# Patient Record
Sex: Female | Born: 1995 | Hispanic: No | Marital: Single | Smoking: Former smoker
Health system: Southern US, Community
[De-identification: ages and names within clinical notes are randomized; demographics above are authoritative.]

## PROBLEM LIST (undated history)

## (undated) DIAGNOSIS — A749 Chlamydial infection, unspecified: Secondary | ICD-10-CM

## (undated) DIAGNOSIS — A549 Gonococcal infection, unspecified: Secondary | ICD-10-CM

## (undated) DIAGNOSIS — R011 Cardiac murmur, unspecified: Secondary | ICD-10-CM

## (undated) HISTORY — PX: NO PAST SURGERIES: SHX2092

---
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
## 2013-04-16 DIAGNOSIS — A549 Gonococcal infection, unspecified: Secondary | ICD-10-CM

## 2013-04-16 HISTORY — DX: Gonococcal infection, unspecified: A54.9

## 2013-05-02 ENCOUNTER — Encounter (HOSPITAL_COMMUNITY): Payer: Self-pay | Admitting: Emergency Medicine

## 2013-05-02 ENCOUNTER — Emergency Department (HOSPITAL_COMMUNITY)
Admission: EM | Admit: 2013-05-02 | Discharge: 2013-05-02 | Disposition: A | Discharge status: Home / Self Care | Payer: Medicaid Other | Attending: Emergency Medicine | Admitting: Emergency Medicine

## 2013-05-02 ENCOUNTER — Emergency Department (HOSPITAL_COMMUNITY): Payer: Medicaid Other

## 2013-05-02 DIAGNOSIS — Z87891 Personal history of nicotine dependence: Secondary | ICD-10-CM | POA: Insufficient documentation

## 2013-05-02 DIAGNOSIS — R05 Cough: Secondary | ICD-10-CM | POA: Insufficient documentation

## 2013-05-02 DIAGNOSIS — R059 Cough, unspecified: Secondary | ICD-10-CM | POA: Insufficient documentation

## 2013-05-02 DIAGNOSIS — N750 Cyst of Bartholin's gland: Secondary | ICD-10-CM | POA: Insufficient documentation

## 2013-05-02 MED ORDER — CEPHALEXIN 500 MG PO CAPS: 500.0000 mg | ORAL_CAPSULE | Freq: Four times a day (QID) | ORAL | Status: DC

## 2013-05-02 MED ORDER — HYDROCODONE-ACETAMINOPHEN 5-325 MG PO TABS: 2.0000 | ORAL_TABLET | Freq: Four times a day (QID) | ORAL | Status: DC | PRN

## 2013-05-02 NOTE — ED Provider Notes (Signed)
CSN: [id number]     Arrival date & time 05/02/13  1426 History   First MD Initiated Contact with Patient 05/02/13 1827     No chief complaint on file.  (Consider location/radiation/quality/duration/timing/severity/associated sxs/prior Treatment) HPI Comments: Patient presents emergency department with chief complaint of cough. She states that she has had a productive cough for the past 5 days. She states that this also irritates her throat. States that she has seen some dark sputum that is mixed with yellow sputum. She denies any chest pain or shortness of breath. Additionally, she states that she has a painful area near her vagina. She states that this is been gradually worsening over the past 2 days. She denies any drainage. Denies any vaginal discharge, or dysuria. She uses the nuvaring for birth control.  The history is provided by the patient. No language interpreter was used.    History reviewed. No pertinent past medical history. History reviewed. No pertinent past surgical history. No family history on file. History  Substance Use Topics  . Smoking status: Former Games developer  . Smokeless tobacco: Not on file  . Alcohol Use: Yes     Comment: occ   OB History   Grav Para Term Preterm Abortions TAB SAB Ect Mult Living                 Review of Systems  All other systems reviewed and are negative.    Allergies  Review of patient's allergies indicates no known allergies.  Home Medications   Current Outpatient Rx  Name  Route  Sig  Dispense  Refill  . etonogestrel-ethinyl estradiol (NUVARING) 0.12-0.015 MG/24HR vaginal ring   Vaginal   Place 1 each vaginally every 28 (twenty-eight) days. Insert vaginally and leave in place for 3 consecutive weeks, then remove for 1 week.          BP 137/85  Pulse 58  Temp(Src) 98.1 F (36.7 C) (Oral)  Resp 18  Wt 180 lb 6 oz (81.818 kg)  SpO2 100%  LMP 04/22/2013 Physical Exam  Nursing note and vitals reviewed. Constitutional: She  is oriented to person, place, and time. She appears well-developed and well-nourished.  HENT:  Head: Normocephalic and atraumatic.  Eyes: Conjunctivae and EOM are normal. Pupils are equal, round, and reactive to light.  Neck: Normal range of motion. Neck supple.  Cardiovascular: Normal rate and regular rhythm.  Exam reveals no gallop and no friction rub.   No murmur heard. Pulmonary/Chest: Effort normal and breath sounds normal. No respiratory distress. She has no wheezes. She has no rales. She exhibits no tenderness.  Abdominal: Soft. She exhibits no distension and no mass. There is no tenderness. There is no rebound and no guarding.  Genitourinary:  Chaperone present during exam, very small Bartholin's cyst is noted, there is no surrounding erythema, no induration or purulent discharge  Musculoskeletal: Normal range of motion. She exhibits no edema and no tenderness.  Neurological: She is alert and oriented to person, place, and time.  Skin: Skin is warm and dry.  Psychiatric: She has a normal mood and affect. Her behavior is normal. Judgment and thought content normal.    ED Course  Procedures (including critical care time) No results found for this or any previous visit. Dg Chest 2 View  05/02/2013   CLINICAL DATA:  Productive cough.  EXAM: CHEST  2 VIEW  COMPARISON:  None.  FINDINGS: The heart size and mediastinal contours are within normal limits. Both lungs are clear. The visualized  skeletal structures are unremarkable.  IMPRESSION: No active cardiopulmonary disease.   Electronically Signed   By: Loralie Champagne M.D.   On: 05/02/2013 19:42      EKG Interpretation   None       MDM   1. Bartholin's cyst   2. Cough     Patient with cough and developing Bartholin's cyst. At this time, will try conservative treatment for the Bartholin's cyst including sitz baths and antibiotic. Recommend outpatient followup with women's. There is no erythema or induration. Do not feel that it  would be amenable to incision and drainage at this time.  CXR is negative.  Well's PE criteria is 1, low-risk, 1.3% chance in ED population.  I highly doubt PE. No chest pain or SOB.  Vitals are stable.  Well appearing.  Patient discussed with Dr. Karma Ganja, who agrees with the plan.    Roxy Horseman, PA-C 05/02/13 [phone]

## 2013-05-02 NOTE — ED Provider Notes (Signed)
Medical screening examination/treatment/procedure(s) were performed by non-physician practitioner and as supervising physician I was immediately available for consultation/collaboration.  EKG Interpretation   None        Mahika Vanvoorhis K Linker, MD 05/02/13 2003 

## 2013-05-02 NOTE — ED Notes (Signed)
Pt states that she has been coughing up red/black mucous.  Second issue she has bump to left side of vagina area that is painful

## 2013-05-06 ENCOUNTER — Encounter (HOSPITAL_COMMUNITY): Payer: Self-pay | Admitting: General Practice

## 2013-05-06 ENCOUNTER — Inpatient Hospital Stay (HOSPITAL_COMMUNITY)
Admission: AD | Admit: 2013-05-06 | Discharge: 2013-05-06 | Disposition: A | Discharge status: Home / Self Care | Payer: Medicaid Other | Source: Ambulatory Visit | Attending: Obstetrics & Gynecology | Admitting: Obstetrics & Gynecology

## 2013-05-06 DIAGNOSIS — N949 Unspecified condition associated with female genital organs and menstrual cycle: Secondary | ICD-10-CM | POA: Insufficient documentation

## 2013-05-06 DIAGNOSIS — Z87891 Personal history of nicotine dependence: Secondary | ICD-10-CM | POA: Insufficient documentation

## 2013-05-06 DIAGNOSIS — N750 Cyst of Bartholin's gland: Secondary | ICD-10-CM

## 2013-05-06 LAB — URINE MICROSCOPIC-ADD ON

## 2013-05-06 LAB — WET PREP, GENITAL
Clue Cells Wet Prep HPF POC: NONE SEEN
Trich, Wet Prep: NONE SEEN
Yeast Wet Prep HPF POC: NONE SEEN

## 2013-05-06 LAB — URINALYSIS, ROUTINE W REFLEX MICROSCOPIC
Bilirubin Urine: NEGATIVE
Glucose, UA: NEGATIVE mg/dL
Ketones, ur: NEGATIVE mg/dL
Protein, ur: NEGATIVE mg/dL
Urobilinogen, UA: 0.2 mg/dL (ref 0.0–1.0)

## 2013-05-06 LAB — POCT PREGNANCY, URINE: Preg Test, Ur: NEGATIVE

## 2013-05-06 NOTE — MAU Provider Note (Signed)
History     CSN: [id number]  Arrival date and time: 05/06/13 1735   First Provider Initiated Contact with Patient 05/06/13 2024      Chief Complaint  Patient presents with  . Vaginal Pain   HPI Ms. Alexa Schaefer is a 17 y.o. G0 female who presents to MAU today with complaint of thin, white malodorous discharge and a lump in her vaginal area. The patient states that she is currently sexually active with 1 partner. She has had ~ 3 partners in the past 6 months. She states that she uses condoms, but had one encounter without a condom recently. She uses the nuva rings for birth control. She denies vaginal bleeding, fever, pelvic pain, N/V/D or constipation or UTI symptoms. She was seen at [facility] recently with complaint of the vaginal bump and given Keflex which she just started taking yesterday.   OB History   Grav Para Term Preterm Abortions TAB SAB Ect Mult Living                  History reviewed. No pertinent past medical history.  History reviewed. No pertinent past surgical history.  History reviewed. No pertinent family history.  History  Substance Use Topics  . Smoking status: Former Games developer  . Smokeless tobacco: Not on file  . Alcohol Use: Yes     Comment: occ    Allergies: No Known Allergies  Prescriptions prior to admission  Medication Sig Dispense Refill  . cephALEXin (KEFLEX) 500 MG capsule Take 1 capsule (500 mg total) by mouth 4 (four) times daily.  40 capsule  0  . HYDROcodone-acetaminophen (NORCO/VICODIN) 5-325 MG per tablet Take 2 tablets by mouth every 6 (six) hours as needed.  15 tablet  0  . etonogestrel-ethinyl estradiol (NUVARING) 0.12-0.015 MG/24HR vaginal ring Place 1 each vaginally every 28 (twenty-eight) days. Insert vaginally and leave in place for 3 consecutive weeks, then remove for 1 week.        Review of Systems  Constitutional: Negative for fever and malaise/fatigue.  Gastrointestinal: Negative for nausea, vomiting, abdominal pain, diarrhea  and constipation.  Genitourinary: Negative for dysuria, urgency and frequency.       + vaginal discharge  Skin: Negative for rash.       + lump in vaginal area   Physical Exam   Blood pressure 144/82, pulse 67, temperature 98.7 F (37.1 C), temperature source Oral, resp. rate 16, height 5' 6.5" (1.689 m), weight 176 lb 3.2 oz (79.924 kg), last menstrual period 04/22/2013, SpO2 100.00%.  Physical Exam  Constitutional: She is oriented to person, place, and time. She appears well-developed and well-nourished. No distress.  HENT:  Head: Normocephalic and atraumatic.  Cardiovascular: Normal rate, regular rhythm and normal heart sounds.   Respiratory: Effort normal and breath sounds normal. No respiratory distress.  GI: Soft. Bowel sounds are normal. She exhibits no distension and no mass. There is no tenderness. There is no rebound and no guarding.  Genitourinary:    There is tenderness (over the bartholin's gland) on the left labia. Uterus is not enlarged and not tender. Cervix exhibits no motion tenderness, no discharge and no friability. Right adnexum displays no mass and no tenderness. Left adnexum displays no mass and no tenderness. No bleeding around the vagina. Vaginal discharge (scant thin, white discharge noted) found.  Neurological: She is alert and oriented to person, place, and time.  Skin: Skin is warm and dry. No erythema.  Psychiatric: She has a normal mood and affect.  Results for orders placed during the hospital encounter of 05/06/13 (from the past 24 hour(s))  URINALYSIS, ROUTINE W REFLEX MICROSCOPIC     Status: Abnormal   Collection Time    05/06/13  5:40 PM      Result Value Range   Color, Urine YELLOW  YELLOW   APPearance CLEAR  CLEAR   Specific Gravity, Urine >1.030 (*) 1.005 - 1.030   pH 6.0  5.0 - 8.0   Glucose, UA NEGATIVE  NEGATIVE mg/dL   Hgb urine dipstick MODERATE (*) NEGATIVE   Bilirubin Urine NEGATIVE  NEGATIVE   Ketones, ur NEGATIVE  NEGATIVE mg/dL     Protein, ur NEGATIVE  NEGATIVE mg/dL   Urobilinogen, UA 0.2  0.0 - 1.0 mg/dL   Nitrite NEGATIVE  NEGATIVE   Leukocytes, UA NEGATIVE  NEGATIVE  URINE MICROSCOPIC-ADD ON     Status: Abnormal   Collection Time    05/06/13  5:40 PM      Result Value Range   Squamous Epithelial / LPF RARE  RARE   WBC, UA 0-2  <3 WBC/hpf   RBC / HPF 7-10  <3 RBC/hpf   Bacteria, UA FEW (*) RARE  POCT PREGNANCY, URINE     Status: None   Collection Time    05/06/13  6:47 PM      Result Value Range   Preg Test, Ur NEGATIVE  NEGATIVE  WET PREP, GENITAL     Status: Abnormal   Collection Time    05/06/13  8:50 PM      Result Value Range   Yeast Wet Prep HPF POC NONE SEEN  NONE SEEN   Trich, Wet Prep NONE SEEN  NONE SEEN   Clue Cells Wet Prep HPF POC NONE SEEN  NONE SEEN   WBC, Wet Prep HPF POC FEW (*) NONE SEEN    MAU Course  Procedures None  MDM UA, Wet prep and GC/Chlamydia today  Assessment and Plan  A: Bartholin's gland cyst  P: Discharge home Patient advised to continue Keflex Rx from [facility] Patient advised to go to [facility] for further STD testing if desired Patient advised to use warm compresses and Sitz baths to attempt to induce spontaneous drainage of the cyst Patient may return to MAU as needed or if her condition were to change or worsen  Freddi Starr, PA-C  05/06/2013, 8:24 PM

## 2013-05-06 NOTE — MAU Note (Signed)
Patient states she has had a "lump" in the inside of the left labia and is painful. Has been there for about 1 1/2 weeks. Has a slight discharge. Wants STI testing.

## 2013-05-07 NOTE — MAU Provider Note (Signed)
Attestation of Attending Supervision of Advanced Practitioner (CNM/NP): Evaluation and management procedures were performed by the Advanced Practitioner under my supervision and collaboration.  I have reviewed the Advanced Practitioner's note and chart, and I agree with the management and plan.  HARRAWAY-SMITH, Bhavin Monjaraz 12:14 AM     

## 2013-05-09 LAB — GC/CHLAMYDIA PROBE AMP: CT Probe RNA: NEGATIVE

## 2013-07-04 ENCOUNTER — Emergency Department (INDEPENDENT_AMBULATORY_CARE_PROVIDER_SITE_OTHER)
Admission: EM | Admit: 2013-07-04 | Discharge: 2013-07-04 | Disposition: A | Discharge status: Home / Self Care | Payer: Medicaid Other | Source: Home / Self Care | Attending: Family Medicine | Admitting: Family Medicine

## 2013-07-04 ENCOUNTER — Encounter (HOSPITAL_COMMUNITY): Payer: Self-pay | Admitting: Emergency Medicine

## 2013-07-04 DIAGNOSIS — J039 Acute tonsillitis, unspecified: Secondary | ICD-10-CM

## 2013-07-04 DIAGNOSIS — J020 Streptococcal pharyngitis: Secondary | ICD-10-CM

## 2013-07-04 MED ORDER — AMOXICILLIN-POT CLAVULANATE 875-125 MG PO TABS
1.0000 | ORAL_TABLET | Freq: Two times a day (BID) | ORAL | Status: DC
Start: 2013-07-04 — End: 2014-01-13

## 2013-07-04 NOTE — ED Notes (Signed)
Dr. Denyse Amassorey cancelled strep screen based on pt.'s history.

## 2013-07-04 NOTE — Discharge Instructions (Signed)
Thank you for coming in today. Take Augmentin twice daily for one week. Take up to 2 Aleve twice daily for pain. Call or go to the emergency room if you get worse, have trouble breathing, have chest pains, or palpitations.

## 2013-07-04 NOTE — ED Provider Notes (Signed)
Alexa Schaefer is a 18 y.o. female who presents to Urgent Care today for one day of sore throat, headache nausea fever. No vomiting or diarrhea. No shortness of breath or cough. Symptoms consistent with prior episodes of strep throat. Roommate with strep throat. Sore throat is severe. She's tried some over-the-counter medications which have helped only a little.   History reviewed. No pertinent past medical history. History  Substance Use Topics  . Smoking status: Former Games developermoker  . Smokeless tobacco: Not on file  . Alcohol Use: Yes     Comment: occ   ROS as above Medications: No current facility-administered medications for this encounter.   Current Outpatient Prescriptions  Medication Sig Dispense Refill  . etonogestrel-ethinyl estradiol (NUVARING) 0.12-0.015 MG/24HR vaginal ring Place 1 each vaginally every 28 (twenty-eight) days. Insert vaginally and leave in place for 3 consecutive weeks, then remove for 1 week.      Marland Kitchen. amoxicillin-clavulanate (AUGMENTIN) 875-125 MG per tablet Take 1 tablet by mouth every 12 (twelve) hours.  14 tablet  0    Exam:  BP 164/79  Pulse 62  Temp(Src) 98.5 F (36.9 C) (Oral)  Resp 16  SpO2 100%  LMP 06/28/2013 Gen: Well NAD HEENT: EOMI,  MMM, bilateral tonsillar hypertrophy erythema and exudate. Membranes are normal appearing bilaterally. Bilateral tender there cervical lymphadenopathy present Lungs: Normal work of breathing. CTABL Heart: RRR no MRG Abd: NABS, Soft. NT, ND no splenomegaly Exts: Brisk capillary refill, warm and well perfused.  Skin: No rash   Assessment and Plan: 18 y.o. female with tonsillitis likely strep pharyngitis. Plan to treat with Augmentin and Aleve. Followup with primary care provider. School note provided. No rapid strep test as pretest probability is too high.  Discussed warning signs or symptoms. Please see discharge instructions. Patient expresses understanding.    Rodolph BongEvan S Monetta Lick, MD 07/04/13 [phone]

## 2013-07-04 NOTE — ED Notes (Signed)
C/o sore throat onset yesterday.  C/o chills and fever, cough, earache, and nausea.

## 2013-07-06 ENCOUNTER — Telehealth (HOSPITAL_COMMUNITY): Payer: Self-pay | Admitting: Family Medicine

## 2013-07-06 NOTE — ED Notes (Signed)
Patient returned and noted that her swelling has worsened. Her pain as somewhat improved with augmentin.  She denies any significant difficulty with swallowing.  She denies any fever.  [facility]  Discussed warning signs or symptoms. Patient expresses understanding.    Rodolph BongEvan S Kiptyn Rafuse, MD 07/06/13 2104

## 2013-07-10 ENCOUNTER — Encounter (HOSPITAL_COMMUNITY): Payer: Self-pay | Admitting: *Deleted

## 2013-07-10 ENCOUNTER — Inpatient Hospital Stay (HOSPITAL_COMMUNITY)
Admission: AD | Admit: 2013-07-10 | Discharge: 2013-07-10 | Disposition: A | Discharge status: Home / Self Care | Payer: Medicaid Other | Source: Ambulatory Visit | Attending: Obstetrics and Gynecology | Admitting: Obstetrics and Gynecology

## 2013-07-10 DIAGNOSIS — B373 Candidiasis of vulva and vagina: Secondary | ICD-10-CM | POA: Insufficient documentation

## 2013-07-10 DIAGNOSIS — Z87891 Personal history of nicotine dependence: Secondary | ICD-10-CM | POA: Insufficient documentation

## 2013-07-10 DIAGNOSIS — L293 Anogenital pruritus, unspecified: Secondary | ICD-10-CM | POA: Insufficient documentation

## 2013-07-10 DIAGNOSIS — N898 Other specified noninflammatory disorders of vagina: Secondary | ICD-10-CM | POA: Insufficient documentation

## 2013-07-10 DIAGNOSIS — B3731 Acute candidiasis of vulva and vagina: Secondary | ICD-10-CM | POA: Insufficient documentation

## 2013-07-10 DIAGNOSIS — B379 Candidiasis, unspecified: Secondary | ICD-10-CM

## 2013-07-10 HISTORY — DX: Chlamydial infection, unspecified: A74.9

## 2013-07-10 HISTORY — DX: Gonococcal infection, unspecified: A54.9

## 2013-07-10 LAB — POCT PREGNANCY, URINE: Preg Test, Ur: NEGATIVE

## 2013-07-10 LAB — URINALYSIS, ROUTINE W REFLEX MICROSCOPIC
Bilirubin Urine: NEGATIVE
Glucose, UA: NEGATIVE mg/dL
KETONES UR: NEGATIVE mg/dL
Nitrite: NEGATIVE
PH: 6 (ref 5.0–8.0)
Protein, ur: NEGATIVE mg/dL
Urobilinogen, UA: 0.2 mg/dL (ref 0.0–1.0)

## 2013-07-10 LAB — URINE MICROSCOPIC-ADD ON

## 2013-07-10 LAB — WET PREP, GENITAL
CLUE CELLS WET PREP: NONE SEEN
Trich, Wet Prep: NONE SEEN

## 2013-07-10 MED ORDER — TERCONAZOLE 0.4 % VA CREA: 1.0000 | TOPICAL_CREAM | Freq: Every day | VAGINAL | Status: DC

## 2013-07-10 MED ORDER — FLUCONAZOLE 150 MG PO TABS
150.0000 mg | ORAL_TABLET | Freq: Once | ORAL | Status: AC
  Administered 2013-07-10: 150 mg via ORAL
  Filled 2013-07-10: qty 1

## 2013-07-10 NOTE — MAU Note (Signed)
Pt reports vaginal discharge off/on with vaginal itching. LMP 06/28/2013

## 2013-07-10 NOTE — MAU Provider Note (Signed)
History     CSN: [id number]  Arrival date and time: 07/10/13 [id number]   First Provider Initiated Contact with Patient 07/10/13 2015      Chief Complaint  Patient presents with  . Vaginal Discharge  . Vaginal Itching   HPI  Pt is a 18 yo presenting with report of vaginal itching that started 3 days ago.  This is the third episode of vaginal itching in the past 4 months.  Reports that the itching has started after taking antibiotics.  Denies any vaginal lesions.  New partner since early December.    Past Medical History  Diagnosis Date  . Gonorrhea Nov 2014  . Chlamydia 2012    History reviewed. No pertinent past surgical history.  Family History  Problem Relation Age of Onset  . Adopted: Yes    History  Substance Use Topics  . Smoking status: Former Games developermoker  . Smokeless tobacco: Not on file  . Alcohol Use: Yes     Comment: occ    Allergies: No Known Allergies  Prescriptions prior to admission  Medication Sig Dispense Refill  . amoxicillin-clavulanate (AUGMENTIN) 875-125 MG per tablet Take 1 tablet by mouth every 12 (twelve) hours.  14 tablet  0  . Pseudoeph-Doxylamine-DM-APAP (NYQUIL PO) Take 15 mLs by mouth daily as needed (cold symptoms).      Marland Kitchen. etonogestrel-ethinyl estradiol (NUVARING) 0.12-0.015 MG/24HR vaginal ring Place 1 each vaginally every 28 (twenty-eight) days. Insert vaginally and leave in place for 3 consecutive weeks, then remove for 1 week.        Review of Systems  Gastrointestinal: Negative for abdominal pain.  Genitourinary: Negative for dysuria.       Vaginal itching and white vaginal discharge  All other systems reviewed and are negative.   Physical Exam   Blood pressure 137/82, pulse 64, temperature 98.3 F (36.8 C), temperature source Oral, resp. rate 16, height 5\' 6"  (1.676 m), weight 83.462 kg (184 lb), last menstrual period 06/28/2013, SpO2 98.00%.  Physical Exam  Constitutional: She is oriented to person, place, and time. She appears  well-developed and well-nourished. No distress.  HENT:  Head: Normocephalic.  Neck: Normal range of motion. Neck supple.  Cardiovascular: Normal rate, regular rhythm and normal heart sounds.   Respiratory: Effort normal and breath sounds normal. No respiratory distress.  GI: Soft. She exhibits no mass. There is no tenderness. There is no rebound and no guarding.  Genitourinary: Cervix exhibits no motion tenderness. No bleeding around the vagina. Vaginal discharge ( white, vagina red) found.  Musculoskeletal: Normal range of motion.  Neurological: She is alert and oriented to person, place, and time.  Skin: Skin is warm and dry.    MAU Course  Procedures  Results for orders placed during the hospital encounter of 07/10/13 (from the past 24 hour(s))  URINALYSIS, ROUTINE W REFLEX MICROSCOPIC     Status: Abnormal   Collection Time    07/10/13  7:40 PM      Result Value Range   Color, Urine YELLOW  YELLOW   APPearance CLEAR  CLEAR   Specific Gravity, Urine >1.030 (*) 1.005 - 1.030   pH 6.0  5.0 - 8.0   Glucose, UA NEGATIVE  NEGATIVE mg/dL   Hgb urine dipstick SMALL (*) NEGATIVE   Bilirubin Urine NEGATIVE  NEGATIVE   Ketones, ur NEGATIVE  NEGATIVE mg/dL   Protein, ur NEGATIVE  NEGATIVE mg/dL   Urobilinogen, UA 0.2  0.0 - 1.0 mg/dL   Nitrite NEGATIVE  NEGATIVE  Leukocytes, UA SMALL (*) NEGATIVE  URINE MICROSCOPIC-ADD ON     Status: Abnormal   Collection Time    07/10/13  7:40 PM      Result Value Range   Squamous Epithelial / LPF FEW (*) RARE   WBC, UA 0-2  <3 WBC/hpf   RBC / HPF 0-2  <3 RBC/hpf   Bacteria, UA FEW (*) RARE   Crystals CA OXALATE CRYSTALS (*) NEGATIVE  POCT PREGNANCY, URINE     Status: None   Collection Time    07/10/13  8:13 PM      Result Value Range   Preg Test, Ur NEGATIVE  NEGATIVE  WET PREP, GENITAL     Status: Abnormal   Collection Time    07/10/13  8:30 PM      Result Value Range   Yeast Wet Prep HPF POC FEW (*) NONE SEEN   Trich, Wet Prep NONE  SEEN  NONE SEEN   Clue Cells Wet Prep HPF POC NONE SEEN  NONE SEEN   WBC, Wet Prep HPF POC MODERATE (*) NONE SEEN     Assessment and Plan  Yeast Infection  Plan: Discharge to home Diflucan 150 mg given in MAU RX Terazol  [facility] 07/10/2013, 8:16 PM

## 2013-07-11 LAB — GC/CHLAMYDIA PROBE AMP
CT Probe RNA: NEGATIVE
GC Probe RNA: NEGATIVE

## 2013-07-13 NOTE — MAU Provider Note (Signed)
`````  Attestation of Attending Supervision of Advanced Practitioner: Evaluation and management procedures were performed by the PA/NP/CNM/OB Fellow under my supervision/collaboration. Chart reviewed and agree with management and plan.  Tilda BurrowFERGUSON,Malania Gawthrop V 07/13/2013 5:33 PM

## 2013-10-19 ENCOUNTER — Encounter (HOSPITAL_COMMUNITY): Payer: Self-pay | Admitting: Emergency Medicine

## 2013-10-19 ENCOUNTER — Emergency Department (INDEPENDENT_AMBULATORY_CARE_PROVIDER_SITE_OTHER)
Admission: EM | Admit: 2013-10-19 | Discharge: 2013-10-19 | Disposition: A | Discharge status: Home / Self Care | Payer: Medicaid Other | Source: Home / Self Care | Attending: Family Medicine | Admitting: Family Medicine

## 2013-10-19 DIAGNOSIS — J039 Acute tonsillitis, unspecified: Secondary | ICD-10-CM

## 2013-10-19 LAB — POCT RAPID STREP A: STREPTOCOCCUS, GROUP A SCREEN (DIRECT): NEGATIVE

## 2013-10-19 MED ORDER — CLINDAMYCIN HCL 300 MG PO CAPS: 300.0000 mg | ORAL_CAPSULE | Freq: Three times a day (TID) | ORAL | Status: DC

## 2013-10-19 NOTE — ED Provider Notes (Signed)
CSN: [id number]     Arrival date & time 10/19/13  1906 History   First MD Initiated Contact with Patient 10/19/13 1932     Chief Complaint  Patient presents with  . Sore Throat   (Consider location/radiation/quality/duration/timing/severity/associated sxs/prior Treatment) Patient is a 18 y.o. female presenting with pharyngitis. The history is provided by the patient.  Sore Throat This is a new problem. The current episode started yesterday. The problem has been gradually worsening. The symptoms are aggravated by swallowing.    Past Medical History  Diagnosis Date  . Gonorrhea Nov 2014  . Chlamydia 2012   History reviewed. No pertinent past surgical history. Family History  Problem Relation Age of Onset  . Adopted: Yes   History  Substance Use Topics  . Smoking status: Former Games developermoker  . Smokeless tobacco: Not on file  . Alcohol Use: Yes     Comment: occ   OB History   Grav Para Term Preterm Abortions TAB SAB Ect Mult Living                 Review of Systems  Constitutional: Positive for fever and chills.  HENT: Positive for sore throat.   Hematological: Positive for adenopathy.    Allergies  Review of patient's allergies indicates no known allergies.  Home Medications   Prior to Admission medications   Medication Sig Start Date End Date Taking? Authorizing Provider  amoxicillin-clavulanate (AUGMENTIN) 875-125 MG per tablet Take 1 tablet by mouth every 12 (twelve) hours. 07/04/13   Rodolph BongEvan S Corey, MD  etonogestrel-ethinyl estradiol (NUVARING) 0.12-0.015 MG/24HR vaginal ring Place 1 each vaginally every 28 (twenty-eight) days. Insert vaginally and leave in place for 3 consecutive weeks, then remove for 1 week.    Historical Provider, MD  Pseudoeph-Doxylamine-DM-APAP (NYQUIL PO) Take 15 mLs by mouth daily as needed (cold symptoms).    Historical Provider, MD  terconazole (TERAZOL 7) 0.4 % vaginal cream Place 1 applicator vaginally at bedtime. 07/10/13   Melissa NoonWalidah N Muhammad,  CNM   BP 126/85  Pulse 87  Temp(Src) 99.7 F (37.6 C) (Oral)  Resp 12  SpO2 98% Physical Exam  Nursing note and vitals reviewed. Constitutional: She is oriented to person, place, and time. She appears well-developed and well-nourished.  HENT:  Right Ear: External ear normal.  Left Ear: External ear normal.  Mouth/Throat: Oropharyngeal exudate present.  Neck: Normal range of motion. Neck supple.  Cardiovascular: Normal heart sounds.   Pulmonary/Chest: Breath sounds normal.  Lymphadenopathy:    She has cervical adenopathy.  Neurological: She is alert and oriented to person, place, and time.  Skin: Skin is warm and dry.    ED Course  Procedures (including critical care time) Labs Review Labs Reviewed  POCT RAPID STREP A ([facility] ONLY)   Strep neg. Imaging Review No results found.   MDM   1. Acute tonsillitis        Linna HoffJames D Kindl, MD 10/19/13 2018

## 2013-10-19 NOTE — ED Notes (Signed)
MD evaluation

## 2013-10-19 NOTE — Discharge Instructions (Signed)
Drink lots of fluids, take all of medicine, use lozenges as needed.return if needed °

## 2013-10-21 LAB — CULTURE, GROUP A STREP

## 2013-10-23 ENCOUNTER — Telehealth (HOSPITAL_COMMUNITY): Payer: Self-pay | Admitting: *Deleted

## 2013-10-23 NOTE — ED Notes (Signed)
I called pt. Pt. verified x 2 and given result.   Pt. told she was adequately treated with Clindamycin.  Pt. instructed to get rechecked if not better after the medication, and notify anyone she exposed if they get the same symptoms to get checked for strep as well. Pt. voiced understanding. Desiree LucySuzanne M Suhey Radford 10/23/2013

## 2014-01-13 ENCOUNTER — Inpatient Hospital Stay (HOSPITAL_COMMUNITY)
Admission: AD | Admit: 2014-01-13 | Discharge: 2014-01-13 | Disposition: A | Discharge status: Home / Self Care | Payer: Medicaid Other | Source: Ambulatory Visit | Attending: Obstetrics & Gynecology | Admitting: Obstetrics & Gynecology

## 2014-01-13 ENCOUNTER — Encounter (HOSPITAL_COMMUNITY): Payer: Self-pay

## 2014-01-13 DIAGNOSIS — Z202 Contact with and (suspected) exposure to infections with a predominantly sexual mode of transmission: Secondary | ICD-10-CM | POA: Diagnosis not present

## 2014-01-13 DIAGNOSIS — Z87891 Personal history of nicotine dependence: Secondary | ICD-10-CM | POA: Diagnosis not present

## 2014-01-13 DIAGNOSIS — Z309 Encounter for contraceptive management, unspecified: Secondary | ICD-10-CM

## 2014-01-13 HISTORY — DX: Cardiac murmur, unspecified: R01.1

## 2014-01-13 LAB — URINALYSIS, ROUTINE W REFLEX MICROSCOPIC
Bilirubin Urine: NEGATIVE
GLUCOSE, UA: NEGATIVE mg/dL
Ketones, ur: 15 mg/dL — AB
Leukocytes, UA: NEGATIVE
Nitrite: NEGATIVE
PROTEIN: NEGATIVE mg/dL
SPECIFIC GRAVITY, URINE: 1.02 (ref 1.005–1.030)
UROBILINOGEN UA: 0.2 mg/dL (ref 0.0–1.0)
pH: 6 (ref 5.0–8.0)

## 2014-01-13 LAB — URINE MICROSCOPIC-ADD ON

## 2014-01-13 LAB — POCT PREGNANCY, URINE: PREG TEST UR: NEGATIVE

## 2014-01-13 LAB — WET PREP, GENITAL
Clue Cells Wet Prep HPF POC: NONE SEEN
TRICH WET PREP: NONE SEEN
WBC WET PREP: NONE SEEN
Yeast Wet Prep HPF POC: NONE SEEN

## 2014-01-13 MED ORDER — AZITHROMYCIN 250 MG PO TABS
1000.0000 mg | ORAL_TABLET | Freq: Once | ORAL | Status: AC
  Administered 2014-01-13: 1000 mg via ORAL
  Filled 2014-01-13: qty 4

## 2014-01-13 MED ORDER — CEFTRIAXONE SODIUM 250 MG IJ SOLR
250.0000 mg | Freq: Once | INTRAMUSCULAR | Status: AC
  Administered 2014-01-13: 250 mg via INTRAMUSCULAR
  Filled 2014-01-13: qty 250

## 2014-01-13 MED ORDER — ETONOGESTREL-ETHINYL ESTRADIOL 0.12-0.015 MG/24HR VA RING: VAGINAL_RING | VAGINAL | Status: AC

## 2014-01-13 NOTE — MAU Provider Note (Signed)
History     CSN: [id number]  Arrival date and time: 01/13/14 1503   None     No chief complaint on file.  HPI Pt is not pregnant and presents for treatment for chlamydia.  Pt was told her boyfriend had been exposed to Chlamydia-that boyfirend had cheated on pt..  Pt has hx of  Chlamydia and gonorrhea.  Patient denies vaginal discharge, itching or burning at this time.  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 RN note: Garvin Filaonna C Coley, RN Registered Nurse Signed  MAU Note Service date: 01/13/2014 3:19 PM   Patient states that her partner told her that he has Chlamydia and she wants to be treated. Denies any pain or vaginal discharge    Past Medical History  Diagnosis Date  . Gonorrhea Nov 2014  . Chlamydia 2012    No past surgical history on file.  Family History  Problem Relation Age of Onset  . Adopted: Yes    History  Substance Use Topics  . Smoking status: Former Games developermoker  . Smokeless tobacco: Not on file  . Alcohol Use: Yes     Comment: occ    Allergies: No Known Allergies  Prescriptions prior to admission  Medication Sig Dispense Refill  . amoxicillin-clavulanate (AUGMENTIN) 875-125 MG per tablet Take 1 tablet by mouth every 12 (twelve) hours.  14 tablet  0  . clindamycin (CLEOCIN) 300 MG capsule Take 1 capsule (300 mg total) by mouth 3 (three) times daily.  30 capsule  0  . etonogestrel-ethinyl estradiol (NUVARING) 0.12-0.015 MG/24HR vaginal ring Place 1 each vaginally every 28 (twenty-eight) days. Insert vaginally and leave in place for 3 consecutive weeks, then remove for 1 week.      . Pseudoeph-Doxylamine-DM-APAP (NYQUIL PO) Take 15 mLs by mouth daily as needed (cold symptoms).      . terconazole (TERAZOL 7) 0.4 % vaginal cream Place 1 applicator vaginally at bedtime.  45 g  0    Review of Systems  Constitutional: Negative for fever and chills.  Gastrointestinal: Negative for nausea, vomiting, abdominal pain, diarrhea and constipation.   Genitourinary: Negative for dysuria.   Physical Exam   Blood pressure 130/80, pulse 77, temperature 98.7 F (37.1 C), temperature source Oral, resp. rate 16, height 5\' 6"  (1.676 m), weight 173 lb 12.8 oz (78.835 kg), last menstrual period 12/30/2013, SpO2 99.00%.  Physical Exam  Nursing note and vitals reviewed. Constitutional: She is oriented to person, place, and time. She appears well-developed and well-nourished. No distress.  HENT:  Head: Normocephalic.  Eyes: Pupils are equal, round, and reactive to light.  Neck: Normal range of motion. Neck supple.  Cardiovascular: Normal rate.   Respiratory: Effort normal.  GI: Soft.  Genitourinary: Vagina normal and uterus normal. No vaginal discharge found.  Uterus NSSC NT; adnexa without palpable enlargement or tenderness; minimal clear discharge in vault, cervix clean, NT  Musculoskeletal: Normal range of motion.  Neurological: She is alert and oriented to person, place, and time.  Skin: Skin is warm and dry.  Psychiatric: She has a normal mood and affect.    MAU Course  Procedures Treated for GC and chlamydia Zithromax 1 gm and Rocephin 250mg  IM Wet prep urinalysis Results for orders placed during the hospital encounter of 01/13/14 (from the past 24 hour(s))  URINALYSIS, ROUTINE W REFLEX MICROSCOPIC     Status: Abnormal   Collection Time    01/13/14  3:30 PM      Result Value Ref Range  Color, Urine YELLOW  YELLOW   APPearance CLEAR  CLEAR   Specific Gravity, Urine 1.020  1.005 - 1.030   pH 6.0  5.0 - 8.0   Glucose, UA NEGATIVE  NEGATIVE mg/dL   Hgb urine dipstick SMALL (*) NEGATIVE   Bilirubin Urine NEGATIVE  NEGATIVE   Ketones, ur 15 (*) NEGATIVE mg/dL   Protein, ur NEGATIVE  NEGATIVE mg/dL   Urobilinogen, UA 0.2  0.0 - 1.0 mg/dL   Nitrite NEGATIVE  NEGATIVE   Leukocytes, UA NEGATIVE  NEGATIVE  URINE MICROSCOPIC-ADD ON     Status: Abnormal   Collection Time    01/13/14  3:30 PM      Result Value Ref Range    Squamous Epithelial / LPF FEW (*) RARE   WBC, UA 3-6  <3 WBC/hpf  POCT PREGNANCY, URINE     Status: None   Collection Time    01/13/14  4:07 PM      Result Value Ref Range   Preg Test, Ur NEGATIVE  NEGATIVE  wet prep and GC/chlamydia pending at time pt left- pt will call for wet prep results later- had to go to work Assessment and Plan  Exposure to Chlamydia contraception- NuvaRing#1 RF x 1 Follow up at [facility] clinic Recommend counseling for pt- [facility] or [facility] [place] Folmar 01/13/2014, 4:13 PM

## 2014-01-13 NOTE — MAU Note (Signed)
Patient states that her partner told her that he has Chlamydia and she wants to be treated. Denies any pain or vaginal discharge.

## 2014-01-14 LAB — GC/CHLAMYDIA PROBE AMP
CT PROBE, AMP APTIMA: POSITIVE — AB
GC Probe RNA: NEGATIVE

## 2014-01-17 NOTE — MAU Provider Note (Signed)
Attestation of Attending Supervision of Advanced Practitioner (CNM/NP): Evaluation and management procedures were performed by the Advanced Practitioner under my supervision and collaboration. I have reviewed the Advanced Practitioner's note and chart, and I agree with the management and plan.  LEGGETT,KELLY H. 1:33 PM   

## 2014-01-29 ENCOUNTER — Inpatient Hospital Stay (HOSPITAL_COMMUNITY)
Admission: AD | Admit: 2014-01-29 | Discharge: 2014-01-29 | Disposition: A | Discharge status: Home / Self Care | Payer: Medicaid Other | Source: Ambulatory Visit | Attending: Obstetrics & Gynecology | Admitting: Obstetrics & Gynecology

## 2014-01-29 ENCOUNTER — Encounter (HOSPITAL_COMMUNITY): Payer: Self-pay | Admitting: *Deleted

## 2014-01-29 DIAGNOSIS — N949 Unspecified condition associated with female genital organs and menstrual cycle: Secondary | ICD-10-CM | POA: Insufficient documentation

## 2014-01-29 DIAGNOSIS — N898 Other specified noninflammatory disorders of vagina: Secondary | ICD-10-CM | POA: Insufficient documentation

## 2014-01-29 DIAGNOSIS — Z87891 Personal history of nicotine dependence: Secondary | ICD-10-CM | POA: Insufficient documentation

## 2014-01-29 DIAGNOSIS — Z202 Contact with and (suspected) exposure to infections with a predominantly sexual mode of transmission: Secondary | ICD-10-CM | POA: Insufficient documentation

## 2014-01-29 LAB — POCT PREGNANCY, URINE: Preg Test, Ur: NEGATIVE

## 2014-01-29 LAB — URINALYSIS, ROUTINE W REFLEX MICROSCOPIC
BILIRUBIN URINE: NEGATIVE
Glucose, UA: NEGATIVE mg/dL
HGB URINE DIPSTICK: NEGATIVE
Ketones, ur: NEGATIVE mg/dL
Leukocytes, UA: NEGATIVE
NITRITE: NEGATIVE
PROTEIN: NEGATIVE mg/dL
Specific Gravity, Urine: 1.025 (ref 1.005–1.030)
Urobilinogen, UA: 0.2 mg/dL (ref 0.0–1.0)
pH: 6 (ref 5.0–8.0)

## 2014-01-29 MED ORDER — AZITHROMYCIN 250 MG PO TABS
1000.0000 mg | ORAL_TABLET | Freq: Once | ORAL | Status: AC
  Administered 2014-01-29: 1000 mg via ORAL
  Filled 2014-01-29: qty 4

## 2014-01-29 NOTE — MAU Provider Note (Signed)
History     CSN: [id number]  Arrival date and time: 01/29/14 1424   None     Chief Complaint  Patient presents with  . Vaginal Discharge  . Vaginal Pain   HPI 18 y.o. female with vaginal discharge and burning. Treated for chlamydia 2 weeks ago, had same symptoms prior to tx, they resolved, then she and her partner had sex less than 7 days after treatment and symptoms returned. Denies any other symptoms, no itching.   Past Medical History  Diagnosis Date  . Gonorrhea Nov 2014  . Chlamydia 2012  . Heart murmur     as a child, resolved    Past Surgical History  Procedure Laterality Date  . No past surgeries      Family History  Problem Relation Age of Onset  . Adopted: Yes    History  Substance Use Topics  . Smoking status: Former Games developermoker  . Smokeless tobacco: Never Used  . Alcohol Use: Yes     Comment: occ    Allergies: No Known Allergies  Prescriptions prior to admission  Medication Sig Dispense Refill  . aspirin 81 MG tablet Take 162 mg by mouth daily as needed for pain.      Marland Kitchen. etonogestrel-ethinyl estradiol (NUVARING) 0.12-0.015 MG/24HR vaginal ring Insert vaginally and leave in place for 3 consecutive weeks, then remove for 1 week.  1 each  12    Review of Systems  Constitutional: Negative.   Respiratory: Negative.   Cardiovascular: Negative.   Gastrointestinal: Negative for nausea, vomiting, abdominal pain, diarrhea and constipation.  Genitourinary: Negative for dysuria, urgency, frequency, hematuria and flank pain.       + discharge and burning   Musculoskeletal: Negative.   Neurological: Negative.   Psychiatric/Behavioral: Negative.    Physical Exam   Blood pressure 128/71, pulse 72, temperature 98.1 F (36.7 C), temperature source Oral, resp. rate 16, height 5\' 6"  (1.676 m), weight 182 lb 2 oz (82.611 kg), last menstrual period 12/30/2013.  Physical Exam  Nursing note and vitals reviewed. Constitutional: She is oriented to person, place, and  time. She appears well-developed and well-nourished. No distress.  Cardiovascular: Normal rate.   Respiratory: Effort normal.  Neurological: She is alert and oriented to person, place, and time.  Skin: Skin is warm and dry.  Psychiatric: She has a normal mood and affect.    MAU Course  Procedures  Results for orders placed during the hospital encounter of 01/29/14 (from the past 24 hour(s))  POCT PREGNANCY, URINE     Status: None   Collection Time    01/29/14  3:14 PM      Result Value Ref Range   Preg Test, Ur NEGATIVE  NEGATIVE   Azithromycin 1000 mg po provided in MAU today  Assessment and Plan   1. Contact with or exposure to venereal diseases   Retreat for Chlamydia, rx for partner treatment provided    Medication List         aspirin 81 MG tablet  Take 162 mg by mouth daily as needed for pain.     etonogestrel-ethinyl estradiol 0.12-0.015 MG/24HR vaginal ring  Commonly known as:  NUVARING  Insert vaginally and leave in place for 3 consecutive weeks, then remove for 1 week.            Follow-up Information   Follow up with [facility] [phone]    Contact information:   [address] [place] [id number]       Follow  up with [facility] [place].   Contact information:   [address] [place] [address] [phone]        FRAZIER,NATALIE 01/29/2014, 3:04 PM

## 2014-01-29 NOTE — MAU Note (Signed)
Patient presents with complaint of vaginal discharge and burning since Thursday. States she and her boyfriend were treated for Chlamydia a couple of weeks ago.

## 2015-07-09 IMAGING — CR DG CHEST 2V
2 series · 2 of 2 positions shown · non-contrast
Comparison: None.

CLINICAL DATA: Productive cough.

EXAM:
CHEST  2 VIEW

[w chest pa]
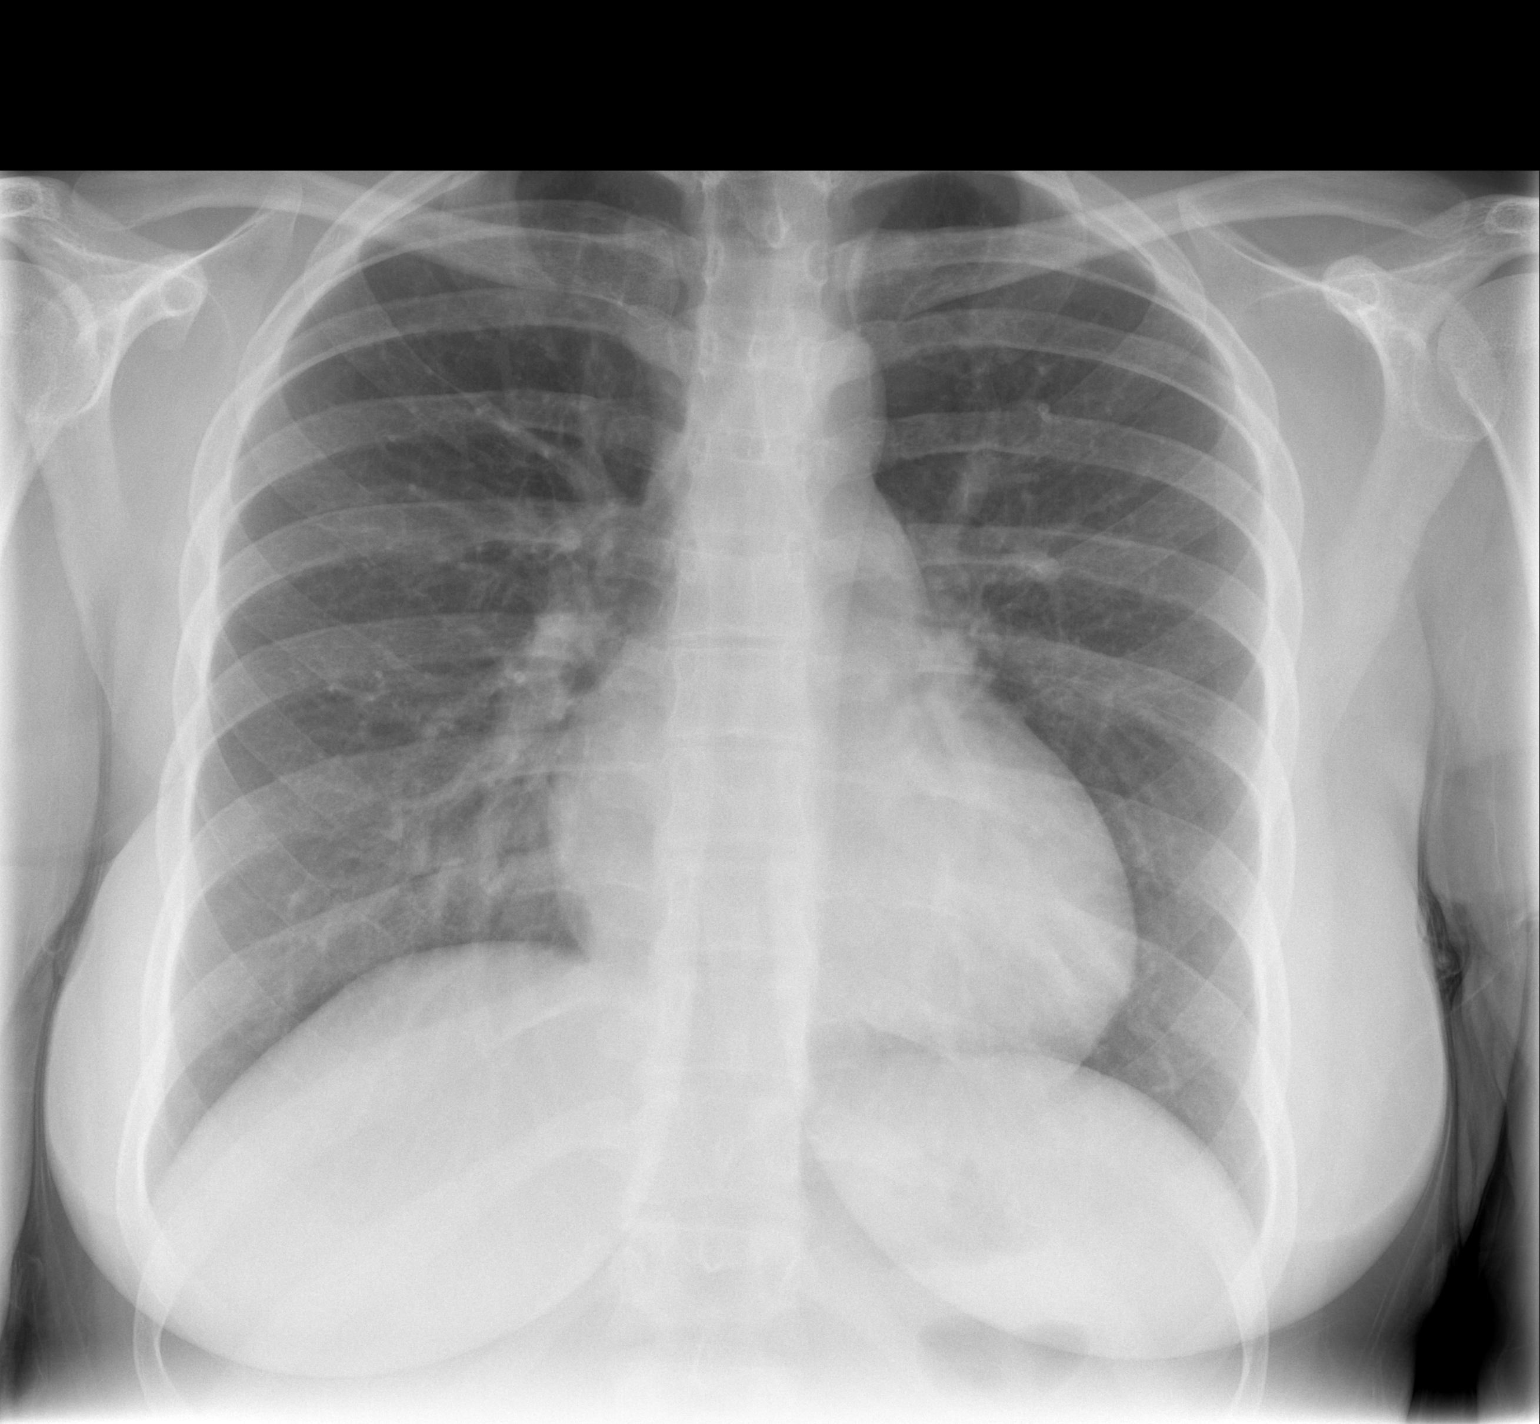

[w chest lat]
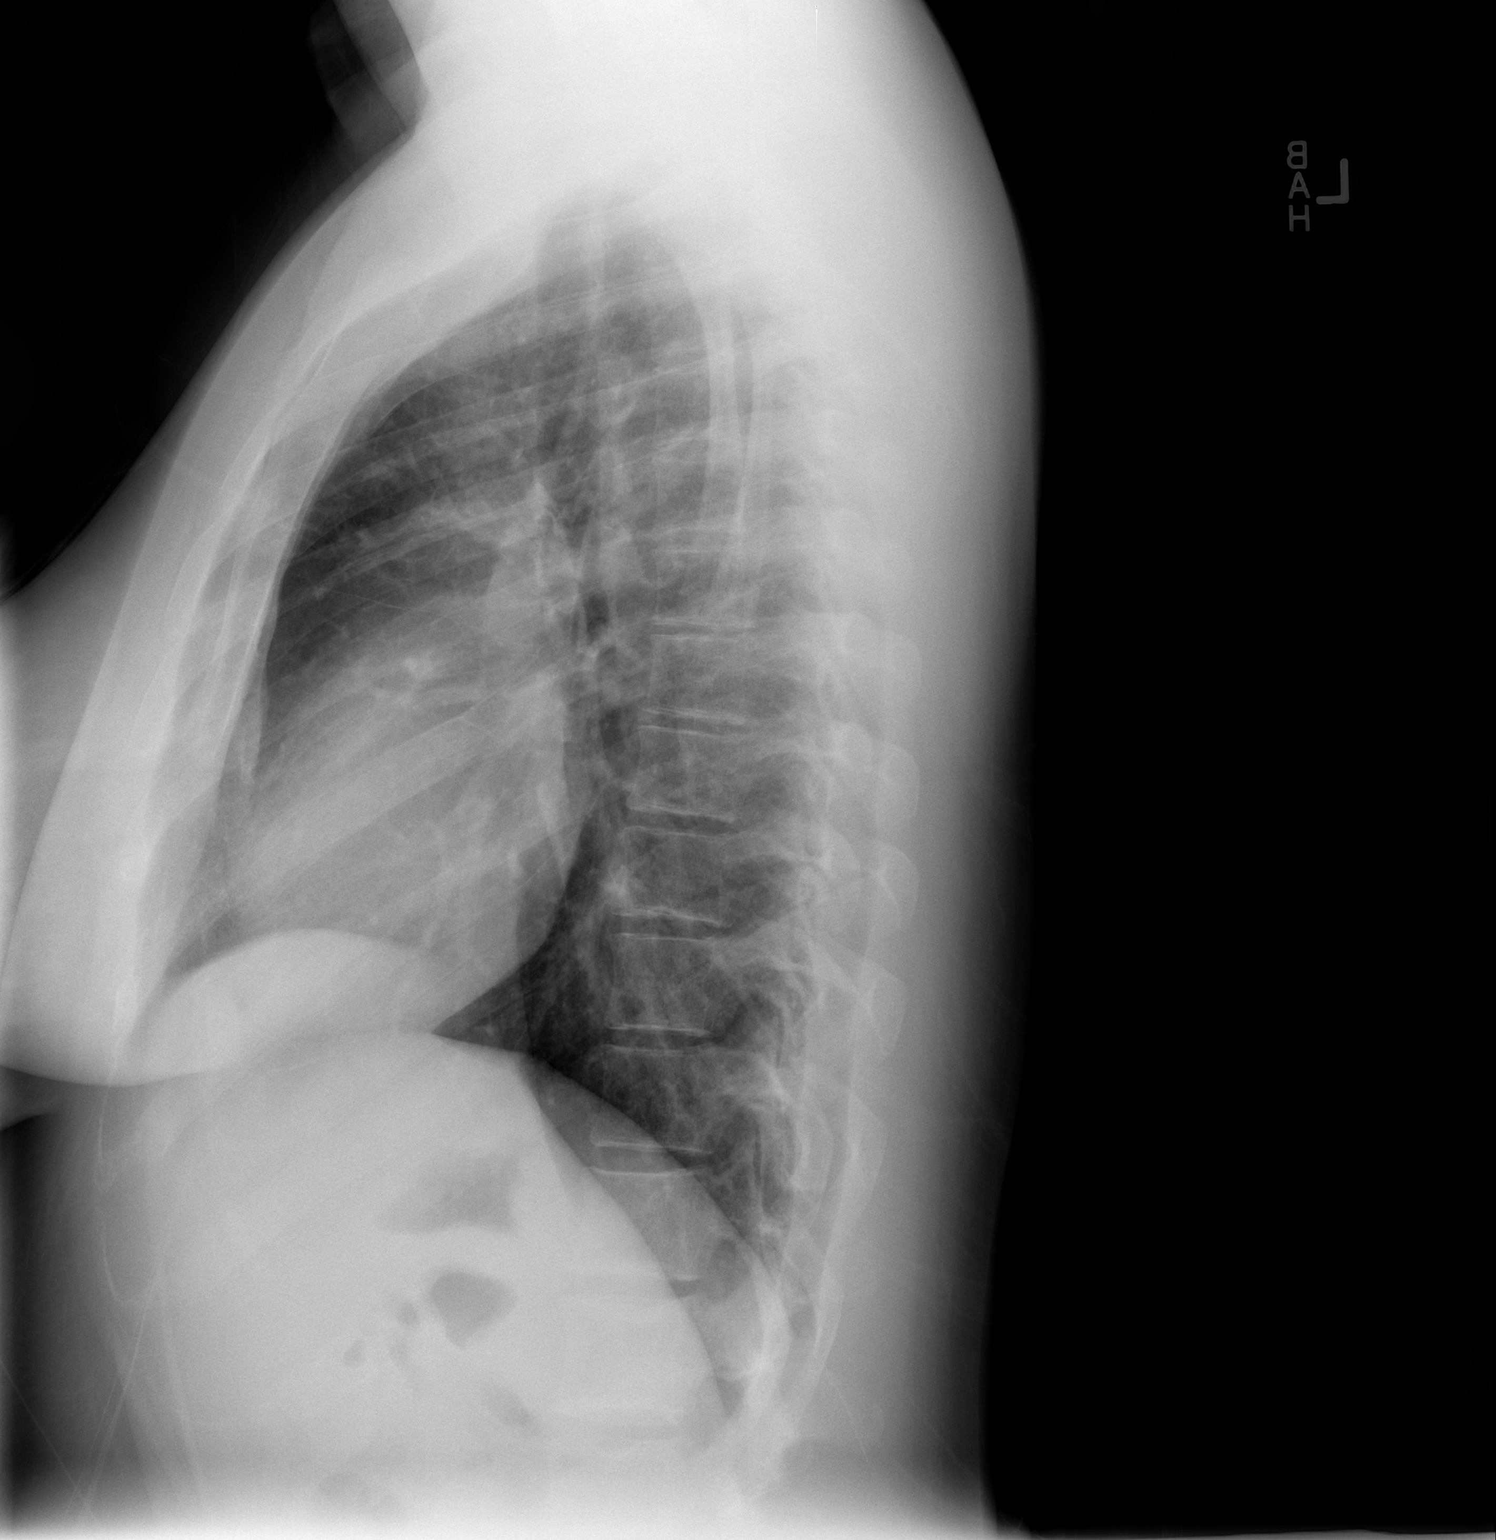

[2 of 2 positions shown; findings below may reference images not displayed]

FINDINGS: The heart size and mediastinal contours are within normal limits.
Both lungs are clear. The visualized skeletal structures are
unremarkable.
IMPRESSION: No active cardiopulmonary disease.
